# Patient Record
Sex: Male | Born: 2000 | Race: White | Hispanic: No | Marital: Single | Smoking: Never smoker
Health system: Southern US, Community
[De-identification: ages and names within clinical notes are randomized; demographics above are authoritative.]

---
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
## 2021-12-27 ENCOUNTER — Ambulatory Visit: Payer: Commercial Managed Care - HMO | Admitting: Podiatry

## 2021-12-27 DIAGNOSIS — L600 Ingrowing nail: Secondary | ICD-10-CM

## 2021-12-27 NOTE — Progress Notes (Unsigned)
Subjective:   Patient ID: Jose Costa, male   DOB: 21 y.o.   MRN: [id number]   HPI Chief Complaint  Patient presents with   Ingrown Toenail     Patient in for a right hallux ingrown toenail, which started 10 years ago, Patient has had some drainage, swelling and redness, patient has some pain, pain rate is a 5 out of 10, TX: some soaking   Goes to school in [place]  Previously had left hallux ingrown procedure done by Dr. Irving Shows.    ROS      Objective:  Physical Exam  ***     Assessment:   Right lateral hallux ingrown toenail     Plan:  -Treatment options discussed including all alternatives, risks, and complications -Etiology of symptoms were discussed -At this time, the patient is requesting partial nail removal with chemical matricectomy to the symptomatic portion of the nail. Risks and complications were discussed with the patient for which they understand and written consent was obtained. Under sterile conditions a total of 3 mL of a mixture of 2% lidocaine plain and 0.5% Marcaine plain was infiltrated in a hallux block fashion. Once anesthetized, the skin was prepped in sterile fashion. A tourniquet was then applied. Next the *** aspect of hallux nail border was then sharply excised making sure to remove the entire offending nail border. Once the nails were ensured to be removed area was debrided and the underlying skin was intact. There is no purulence identified in the procedure. Next phenol was then applied under standard conditions and copiously irrigated. Silvadene was applied. A dry sterile dressing was applied. After application of the dressing the tourniquet was removed and there is found to be an immediate capillary refill time to the digit. The patient tolerated the procedure well any complications. Post procedure instructions were discussed the patient for which he verbally understood. Follow-up in one week for nail check or sooner if any problems are to arise. Discussed  signs/symptoms of infection and directed to call the office immediately should any occur or go directly to the emergency room. In the meantime, encouraged to call the office with any questions, concerns, changes symptoms.

## 2021-12-27 NOTE — Patient Instructions (Signed)
